# Patient Record
Sex: Male | Born: 1945 | Race: White | Hispanic: No | Marital: Married | Smoking: Former smoker
Health system: Southern US, Community
[De-identification: ages and names within clinical notes are randomized; demographics above are authoritative.]

## PROBLEM LIST (undated history)

## (undated) DIAGNOSIS — Z8719 Personal history of other diseases of the digestive system: Secondary | ICD-10-CM

## (undated) DIAGNOSIS — M1990 Unspecified osteoarthritis, unspecified site: Secondary | ICD-10-CM

## (undated) DIAGNOSIS — N400 Enlarged prostate without lower urinary tract symptoms: Secondary | ICD-10-CM

## (undated) DIAGNOSIS — C801 Malignant (primary) neoplasm, unspecified: Secondary | ICD-10-CM

## (undated) DIAGNOSIS — K5792 Diverticulitis of intestine, part unspecified, without perforation or abscess without bleeding: Secondary | ICD-10-CM

## (undated) DIAGNOSIS — Z86010 Personal history of colonic polyps: Secondary | ICD-10-CM

## (undated) DIAGNOSIS — H25019 Cortical age-related cataract, unspecified eye: Secondary | ICD-10-CM

## (undated) HISTORY — PX: HERNIA REPAIR: SHX51

## (undated) HISTORY — PX: COLONOSCOPY: SHX174

## (undated) HISTORY — PX: JOINT REPLACEMENT: SHX530

## (undated) HISTORY — PX: REPLACEMENT TOTAL KNEE: SUR1224

## (undated) HISTORY — PX: EYE SURGERY: SHX253

---
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
## 2005-10-13 ENCOUNTER — Ambulatory Visit: Payer: Self-pay | Admitting: Otolaryngology

## 2011-07-03 ENCOUNTER — Ambulatory Visit: Payer: Self-pay | Admitting: Family Medicine

## 2014-07-14 ENCOUNTER — Ambulatory Visit: Payer: Self-pay | Admitting: Ophthalmology

## 2014-07-24 HISTORY — PX: CATARACT EXTRACTION W/ INTRAOCULAR LENS IMPLANT: SHX1309

## 2014-07-25 ENCOUNTER — Ambulatory Visit: Payer: Self-pay | Admitting: Ophthalmology

## 2014-11-05 NOTE — Op Note (Signed)
PATIENT NAME:  Corey Edwards, Corey Edwards MR#:  [id number] DATE OF BIRTH:  1946-06-30  DATE OF PROCEDURE:  07/25/2014  PREOPERATIVE DIAGNOSIS:  Nuclear sclerotic cataract of the left eye.   POSTOPERATIVE DIAGNOSIS:  Nuclear sclerotic cataract of the left eye.   OPERATIVE PROCEDURE:  Cataract extraction by phacoemulsification with implant of intraocular lens to left eye.   SURGEON:  Birder Robson, MD.   ANESTHESIA:  1. Managed anesthesia care.  2. Topical tetracaine drops followed by 2% Xylocaine jelly applied in the preoperative holding area.   COMPLICATIONS:  None.   TECHNIQUE:   Stop and chop.  DESCRIPTION OF PROCEDURE:  The patient was examined and consented in the preoperative holding area where the aforementioned topical anesthesia was applied to the left eye and then brought back to the operating room where the left eye was prepped and draped in the usual sterile ophthalmic fashion and a lid speculum was placed. A paracentesis was created with the side port blade and the anterior chamber was filled with viscoelastic. A near clear corneal incision was performed with the steel keratome. A continuous curvilinear capsulorrhexis was performed with a cystotome followed by the capsulorrhexis forceps. Hydrodissection and hydrodelineation were carried out with BSS on a blunt cannula. The lens was removed in a stop and chop technique and the remaining cortical material was removed with the irrigation-aspiration handpiece. The capsular bag was inflated with viscoelastic and the Tecnis ZCB00 18.5-diopter lens, serial number [id number] was placed in the capsular bag without complication. The remaining viscoelastic was removed from the eye with the irrigation-aspiration handpiece. The wounds were hydrated. The anterior chamber was flushed with Miostat and the eye was inflated to physiologic pressure. 0.1 mL of cefuroxime concentration 10 mg/mL was placed in the anterior chamber. The wounds were found to be water  tight. The eye was dressed with Vigamox. The patient was given protective glasses to wear throughout the day and a shield with which to sleep tonight. The patient was also given drops with which to begin a drop regimen today and will follow-up with me in one day.   Please note that a single 10-0 nylon suture was placed through the incision for added security.    ____________________________ Livingston Diones Avenly Roberge, MD wlp:bm D: 07/25/2014 20:51:21 ET T: 07/25/2014 23:03:33 ET JOB#: [id number]  cc: Lyon Dumont L. Valory Wetherby, MD, <Dictator> Livingston Diones Charlie Char MD ELECTRONICALLY SIGNED 07/26/2014 16:10

## 2015-04-11 ENCOUNTER — Other Ambulatory Visit: Payer: Self-pay | Admitting: Unknown Physician Specialty

## 2015-04-11 DIAGNOSIS — M25561 Pain in right knee: Secondary | ICD-10-CM

## 2015-04-11 DIAGNOSIS — M25562 Pain in left knee: Principal | ICD-10-CM

## 2015-04-13 ENCOUNTER — Other Ambulatory Visit: Payer: Self-pay | Admitting: Unknown Physician Specialty

## 2015-04-20 ENCOUNTER — Other Ambulatory Visit: Payer: Self-pay

## 2015-04-23 ENCOUNTER — Ambulatory Visit
Admission: RE | Admit: 2015-04-23 | Discharge: 2015-04-23 | Disposition: A | Discharge status: Home / Self Care | Payer: Medicare Other | Source: Ambulatory Visit | Attending: Unknown Physician Specialty | Admitting: Unknown Physician Specialty

## 2015-04-23 DIAGNOSIS — M25461 Effusion, right knee: Secondary | ICD-10-CM | POA: Diagnosis not present

## 2015-04-23 DIAGNOSIS — X58XXXA Exposure to other specified factors, initial encounter: Secondary | ICD-10-CM | POA: Diagnosis not present

## 2015-04-23 DIAGNOSIS — M25562 Pain in left knee: Secondary | ICD-10-CM | POA: Diagnosis present

## 2015-04-23 DIAGNOSIS — M7122 Synovial cyst of popliteal space [Baker], left knee: Secondary | ICD-10-CM | POA: Insufficient documentation

## 2015-04-23 DIAGNOSIS — M659 Synovitis and tenosynovitis, unspecified: Secondary | ICD-10-CM | POA: Insufficient documentation

## 2015-04-23 DIAGNOSIS — M25561 Pain in right knee: Secondary | ICD-10-CM

## 2015-04-23 DIAGNOSIS — S83251A Bucket-handle tear of lateral meniscus, current injury, right knee, initial encounter: Secondary | ICD-10-CM | POA: Diagnosis not present

## 2015-05-07 ENCOUNTER — Encounter: Payer: Self-pay | Admitting: *Deleted

## 2015-05-11 ENCOUNTER — Ambulatory Visit
Admission: RE | Admit: 2015-05-11 | Discharge: 2015-05-11 | Disposition: A | Discharge status: Home / Self Care | Payer: Medicare Other | Source: Ambulatory Visit | Attending: Unknown Physician Specialty | Admitting: Unknown Physician Specialty

## 2015-05-11 ENCOUNTER — Ambulatory Visit: Payer: Medicare Other | Admitting: Anesthesiology

## 2015-05-11 ENCOUNTER — Encounter
Admission: RE | Disposition: A | Discharge status: Home / Self Care | Payer: Self-pay | Source: Ambulatory Visit | Attending: Unknown Physician Specialty

## 2015-05-11 DIAGNOSIS — Z91048 Other nonmedicinal substance allergy status: Secondary | ICD-10-CM | POA: Insufficient documentation

## 2015-05-11 DIAGNOSIS — M23251 Derangement of posterior horn of lateral meniscus due to old tear or injury, right knee: Secondary | ICD-10-CM | POA: Diagnosis not present

## 2015-05-11 DIAGNOSIS — M23241 Derangement of anterior horn of lateral meniscus due to old tear or injury, right knee: Secondary | ICD-10-CM | POA: Insufficient documentation

## 2015-05-11 DIAGNOSIS — Z885 Allergy status to narcotic agent status: Secondary | ICD-10-CM | POA: Insufficient documentation

## 2015-05-11 DIAGNOSIS — Z881 Allergy status to other antibiotic agents status: Secondary | ICD-10-CM | POA: Insufficient documentation

## 2015-05-11 DIAGNOSIS — Z888 Allergy status to other drugs, medicaments and biological substances status: Secondary | ICD-10-CM | POA: Diagnosis not present

## 2015-05-11 DIAGNOSIS — M25561 Pain in right knee: Secondary | ICD-10-CM | POA: Diagnosis present

## 2015-05-11 DIAGNOSIS — Z79899 Other long term (current) drug therapy: Secondary | ICD-10-CM | POA: Diagnosis not present

## 2015-05-11 DIAGNOSIS — H259 Unspecified age-related cataract: Secondary | ICD-10-CM | POA: Diagnosis not present

## 2015-05-11 DIAGNOSIS — Z9842 Cataract extraction status, left eye: Secondary | ICD-10-CM | POA: Diagnosis not present

## 2015-05-11 DIAGNOSIS — N400 Enlarged prostate without lower urinary tract symptoms: Secondary | ICD-10-CM | POA: Insufficient documentation

## 2015-05-11 HISTORY — DX: Unspecified osteoarthritis, unspecified site: M19.90

## 2015-05-11 HISTORY — DX: Benign prostatic hyperplasia without lower urinary tract symptoms: N40.0

## 2015-05-11 HISTORY — PX: KNEE ARTHROSCOPY: SHX127

## 2015-05-11 SURGERY — ARTHROSCOPY, KNEE
Anesthesia: General | Site: Knee | Laterality: Right | Wound class: Clean

## 2015-05-11 MED ORDER — DEXAMETHASONE SODIUM PHOSPHATE 4 MG/ML IJ SOLN
INTRAMUSCULAR | Status: DC | PRN
  Administered 2015-05-11: 4 mg via INTRAVENOUS

## 2015-05-11 MED ORDER — OXYCODONE HCL 5 MG/5ML PO SOLN: 5.0000 mg | Freq: Once | ORAL | Status: AC | PRN

## 2015-05-11 MED ORDER — LIDOCAINE HCL (CARDIAC) 20 MG/ML IV SOLN
INTRAVENOUS | Status: DC | PRN
  Administered 2015-05-11: 50 mg via INTRATRACHEAL

## 2015-05-11 MED ORDER — PROMETHAZINE HCL 25 MG/ML IJ SOLN: 6.2500 mg | INTRAMUSCULAR | Status: DC | PRN

## 2015-05-11 MED ORDER — LACTATED RINGERS IV SOLN
INTRAVENOUS | Status: DC
  Administered 2015-05-11: 10:00:00 via INTRAVENOUS

## 2015-05-11 MED ORDER — FENTANYL CITRATE (PF) 100 MCG/2ML IJ SOLN
INTRAMUSCULAR | Status: DC | PRN
  Administered 2015-05-11: 100 ug via INTRAVENOUS

## 2015-05-11 MED ORDER — MIDAZOLAM HCL 5 MG/5ML IJ SOLN
INTRAMUSCULAR | Status: DC | PRN
  Administered 2015-05-11: 2 mg via INTRAVENOUS

## 2015-05-11 MED ORDER — MEPERIDINE HCL 25 MG/ML IJ SOLN: 6.2500 mg | INTRAMUSCULAR | Status: DC | PRN

## 2015-05-11 MED ORDER — EPHEDRINE SULFATE 50 MG/ML IJ SOLN
INTRAMUSCULAR | Status: DC | PRN
  Administered 2015-05-11: 10 mg via INTRAVENOUS

## 2015-05-11 MED ORDER — PROPOFOL 10 MG/ML IV BOLUS
INTRAVENOUS | Status: DC | PRN
  Administered 2015-05-11: 150 mg via INTRAVENOUS

## 2015-05-11 MED ORDER — GLYCOPYRROLATE 0.2 MG/ML IJ SOLN
INTRAMUSCULAR | Status: DC | PRN
  Administered 2015-05-11: 0.1 mg via INTRAVENOUS

## 2015-05-11 MED ORDER — OXYCODONE HCL 5 MG PO TABS
5.0000 mg | ORAL_TABLET | Freq: Once | ORAL | Status: AC | PRN
Start: 2015-05-11 — End: 2015-05-11
  Administered 2015-05-11: 5 mg via ORAL

## 2015-05-11 MED ORDER — BUPIVACAINE HCL (PF) 0.5 % IJ SOLN
INTRAMUSCULAR | Status: DC | PRN
  Administered 2015-05-11: 10 mL

## 2015-05-11 MED ORDER — ONDANSETRON HCL 4 MG/2ML IJ SOLN
INTRAMUSCULAR | Status: DC | PRN
  Administered 2015-05-11: 4 mg via INTRAVENOUS

## 2015-05-11 MED ORDER — HYDROMORPHONE HCL 1 MG/ML IJ SOLN
0.2500 mg | INTRAMUSCULAR | Status: DC | PRN
  Administered 2015-05-11: 0.4 mg via INTRAVENOUS

## 2015-05-11 SURGICAL SUPPLY — 41 items
ARTHROWAND PARAGON T2 (SURGICAL WAND)
BLADE ABRADER 4.5 (BLADE) ×2 IMPLANT
BLADE FULL RADIUS 3.5 (BLADE) IMPLANT
BLADE SHAVER 4.5X7 STR FR (MISCELLANEOUS) ×2 IMPLANT
BLADE SHAVER AGGRES 5.5  STR (CUTTER)
BLADE SHAVER AGGRES 5.5 STR (CUTTER) IMPLANT
BNDG ESMARK 6X12 TAN STRL LF (GAUZE/BANDAGES/DRESSINGS) IMPLANT
BUR ABRADER 4.0 W/FLUTE AQUA (MISCELLANEOUS) IMPLANT
BUR ACROMIONIZER 4.0 (BURR) IMPLANT
BURR ABRADER 4.0 W/FLUTE AQUA (MISCELLANEOUS)
BURR ROUND 12 FLUTE 4.0MM (BURR) IMPLANT
COVER LIGHT HANDLE FLEXIBLE (MISCELLANEOUS) ×2 IMPLANT
CUFF TOURN SGL QUICK 30 (MISCELLANEOUS) ×1
CUFF TRNQT CYL LO 30X4X (MISCELLANEOUS) ×1 IMPLANT
CUTTER SLOTTED WHISKER 4.0 (BURR) IMPLANT
DRAPE LEGGINS SURG 28X43 STRL (DRAPES) ×2 IMPLANT
DURAPREP 26ML APPLICATOR (WOUND CARE) ×2 IMPLANT
GAUZE SPONGE 4X4 12PLY STRL (GAUZE/BANDAGES/DRESSINGS) ×2 IMPLANT
GLOVE BIO SURGEON STRL SZ7.5 (GLOVE) ×4 IMPLANT
GLOVE BIO SURGEON STRL SZ8 (GLOVE) ×2 IMPLANT
GLOVE INDICATOR 8.0 STRL GRN (GLOVE) ×4 IMPLANT
GOWN STRL REIN 2XL XLG LVL4 (GOWN DISPOSABLE) ×4 IMPLANT
GOWN STRL REUS W/TWL 2XL LVL3 (GOWN DISPOSABLE) IMPLANT
IV LACTATED RINGER IRRG 3000ML (IV SOLUTION) ×2
IV LR IRRIG 3000ML ARTHROMATIC (IV SOLUTION) ×2 IMPLANT
MANIFOLD 4PT FOR NEPTUNE1 (MISCELLANEOUS) ×2 IMPLANT
PACK ARTHROSCOPY KNEE (MISCELLANEOUS) ×2 IMPLANT
SET TUBE SUCT SHAVER OUTFL 24K (TUBING) ×2 IMPLANT
SOL PREP PVP 2OZ (MISCELLANEOUS) ×2
SOLUTION PREP PVP 2OZ (MISCELLANEOUS) ×1 IMPLANT
SUT ETHILON 3-0 FS-10 30 BLK (SUTURE) ×2
SUTURE EHLN 3-0 FS-10 30 BLK (SUTURE) ×1 IMPLANT
TAPE MICROFOAM 4IN (TAPE) ×2 IMPLANT
TUBING ARTHRO INFLOW-ONLY STRL (TUBING) ×2 IMPLANT
WAND ARTHRO PARAGON T2 (SURGICAL WAND) IMPLANT
WAND COVAC 50 IFS (MISCELLANEOUS) ×2 IMPLANT
WAND HAND CNTRL MULTIVAC 50 (MISCELLANEOUS) IMPLANT
WAND HAND CNTRL MULTIVAC 90 (MISCELLANEOUS) IMPLANT
WAND MEGAVAC 90 (MISCELLANEOUS) IMPLANT
WAND ULTRAVAC 90 (MISCELLANEOUS) IMPLANT
WRAP KNEE W/COLD PACKS 25.5X14 (SOFTGOODS) ×2 IMPLANT

## 2015-05-11 NOTE — Discharge Instructions (Signed)
General Anesthesia, Adult, Care After Refer to this sheet in the next few weeks. These instructions provide you with information on caring for yourself after your procedure. Your health care provider may also give you more specific instructions. Your treatment has been planned according to current medical practices, but problems sometimes occur. Call your health care provider if you have any problems or questions after your procedure. WHAT TO EXPECT AFTER THE PROCEDURE After the procedure, it is typical to experience:  Sleepiness.  Nausea and vomiting. HOME CARE INSTRUCTIONS  For the first 24 hours after general anesthesia:  Have a responsible person with you.  Do not drive a car. If you are alone, do not take public transportation.  Do not drink alcohol.  Do not take medicine that has not been prescribed by your health care provider.  Do not sign important papers or make important decisions.  You may resume a normal diet and activities as directed by your health care provider.  Change bandages (dressings) as directed.  If you have questions or problems that seem related to general anesthesia, call the hospital and ask for the anesthetist or anesthesiologist on call. SEEK MEDICAL CARE IF:  You have nausea and vomiting that continue the day after anesthesia.  You develop a rash. SEEK IMMEDIATE MEDICAL CARE IF:   You have difficulty breathing.  You have chest pain.  You have any allergic problems.   This information is not intended to replace advice given to you by your health care provider. Make sure you discuss any questions you have with your health care provider.   Document Released: 09/29/2000 Document Revised: 07/14/2014 Document Reviewed: 10/22/2011 Elsevier Interactive Patient Education 2016 [employer]   [facility]  Kathrene Alu., M.D. [phone]   KNEE ARTHROSCOPY POST OPERATION INSTRUCTIONS:  PLEASE  READ THESE INSTRUCTIONS ABOUT POST OPERATION CARE. THEY WILL ANSWER MOST OF YOUR QUESTIONS.  You have been given a prescription for pain. Please take as directed for pain.  You can walk, keeping the knee slightly stiff-avoid doing too much bending the first day. (if ACL reconstruction is performed, keep brace locked in extension when walking.)  You will use crutches or cane if needed. Can weight bear as tolerated  Plan to take three to four days off from work. You can resume work when you are comfortable. (This can be a week or more, depending on the type of work you do.)  To reduce pain and swelling, place one to two pillows under the knee the first two or three days when sitting or lying. An ice pack may be placed on top of the area over the dressing. Instructions for making homemade icepack are as follow:  Flexible homemade alcohol water ice pack  2 cups water  1 cup rubbing alcohol  food coloring for the blue tint (optional)  2 zip-top bags - gallon-size  Mix the water and alcohol together in one of your zip-top bags and add food coloring. Release as much air as possible and seal the bag. Place in freezer for at least 12 hours.  The small incisions in your knee are closed with nylon stitches. They will be removed in the office.  The bulky dressing may be removed in the third day after surgery. (If ACL surgery-DO NOT REMOVE BANDAGES). Put a waterproof band-aid over each stitch. Do not put any creams or ointments on wounds. You may shower at this time, but change waterproof band-aids after showering. [place]  AND DRY UNTIL YOU RETURN TO THE OFFICE.  Sometimes the operative area remains somewhat painful and swollen for several weeks. This is usually nothing to worry about, but call if you have any excessive symptoms, especially fever. It is not unusual to have a low grade fever of 99 degrees for the first few days. If persist after 3-4 days call the office. It is not uncommon for the pain  to be a little worse on the third day after surgery.  Begin doing gentle exercises right away. They will be limited by the amount of pain and swelling you have.  Exercising will reduce the swelling, increase motion, and prevent muscle weakness. Exercises: Straight leg raising and gentle knee bending.  Take 81 milligram aspirin twice a day for 2 weeks after meals or milk. This along with elevation will help reduce the possibility of phlebitis in your operated leg.  Avoid strenuous athletics for a minimum of 4 to 6 weeks after arthroscopic surgery (approximately five months if ACL surgery).  If the surgery included ACL reconstruction the brace that is supplied to the extremity post surgery is to be locked in extension when you are asleep and is to be locked in extension when you are ambulating. It can be unlocked for exercises or sitting.  Keep your post surgery appointment that has been made for you. If you do not remember the date call [phone]. Your follow up appointment should be between 7-10 days.

## 2015-05-11 NOTE — Anesthesia Preprocedure Evaluation (Signed)
Anesthesia Evaluation  Patient identified by MRN, date of birth, ID band Patient awake    Reviewed: Allergy & Precautions, NPO status , Patient's Chart, lab work & pertinent test results, reviewed documented beta blocker date and time   Airway Mallampati: II  TM Distance: >3 FB Neck ROM: Full    Dental no notable dental hx.    Pulmonary former smoker,    Pulmonary exam normal        Cardiovascular negative cardio ROS Normal cardiovascular exam     Neuro/Psych negative neurological ROS  negative psych ROS   GI/Hepatic negative GI ROS, Neg liver ROS,   Endo/Other  negative endocrine ROS  Renal/GU negative Renal ROS   BPH     Musculoskeletal  (+) Arthritis , Osteoarthritis,    Abdominal   Peds negative pediatric ROS (+)  Hematology negative hematology ROS (+)   Anesthesia Other Findings   Reproductive/Obstetrics                             Anesthesia Physical Anesthesia Plan  ASA: II  Anesthesia Plan: General   Post-op Pain Management:    Induction:   Airway Management Planned: LMA  Additional Equipment:   Intra-op Plan:   Post-operative Plan:   Informed Consent: I have reviewed the patients History and Physical, chart, labs and discussed the procedure including the risks, benefits and alternatives for the proposed anesthesia with the patient or authorized representative who has indicated his/her understanding and acceptance.     Plan Discussed with: CRNA  Anesthesia Plan Comments:         Anesthesia Quick Evaluation

## 2015-05-11 NOTE — Transfer of Care (Signed)
Immediate Anesthesia Transfer of Care Note  Patient: Corey Edwards  Procedure(s) Performed: Procedure(s): ARTHROSCOPY KNEE WITH PARTIAL LATERAL MENISECTOMY (Right)  Patient Location: PACU  Anesthesia Type: General  Level of Consciousness: awake, alert  and patient cooperative  Airway and Oxygen Therapy: Patient Spontanous Breathing and Patient connected to supplemental oxygen  Post-op Assessment: Post-op Vital signs reviewed, Patient's Cardiovascular Status Stable, Respiratory Function Stable, Patent Airway and No signs of Nausea or vomiting  Post-op Vital Signs: Reviewed and stable  Complications: No apparent anesthesia complications

## 2015-05-11 NOTE — Anesthesia Procedure Notes (Signed)
Procedure Name: LMA Insertion Date/Time: 05/11/2015 10:29 AM Performed by: Mayme Genta Pre-anesthesia Checklist: Patient identified, Emergency Drugs available, Suction available, Timeout performed and Patient being monitored Patient Re-evaluated:Patient Re-evaluated prior to inductionOxygen Delivery Method: Circle system utilized Preoxygenation: Pre-oxygenation with 100% oxygen Intubation Type: IV induction LMA: LMA inserted LMA Size: 4.0 Number of attempts: 1 Placement Confirmation: positive ETCO2 and breath sounds checked- equal and bilateral Tube secured with: Tape

## 2015-05-11 NOTE — Op Note (Signed)
Patient: Corey Edwards  Preoperative diagnosis: Torn lateral meniscus and early lateral compartment chondral changes right knee  Postop diagnosis: Same plus trochlear groove chondral changes  Operation: Arthroscopic partial lateral meniscectomy right knee  Surgeon: Vilinda Flake, MD  Anesthesia: Gen.   History: Patient's had a long history of right knee pain.  The plain films revealed possible early lateral compartment chondral changes .  The patient had an MRI which revealed a torn lateral meniscus along with lateral compartment chondral pathology.The patient was scheduled for surgery due to persistent discomfort despite conservative treatment.  The patient was taken the operating room where satisfactory general anesthesia was achieved. A tourniquet and leg holder were was applied to the right thigh. A well leg support was applied to the nonoperative extremity. The right knee was prepped and draped in usual fashion for an arthroscopic procedure. An inflow cannula was introduced superomedially. The joint was distended with lactated Ringer's. Scope was introduced through an inferolateral puncture wound and a probe through an inferomedial puncture wound. Inspection of the medial compartment revealed  no meniscal or chondral pathology. Inspection of the intercondylar notch revealed intact cruciates. Inspection of the the lateral compartment revealed a degenerative tear of the lateral meniscus that involved anterior and posterior horns. I went ahead and resected the torn lateral meniscus using a combination of a motorized resector and an angled ArthroCare wand.  Trochlear groove was inspected and revealed some grade 3 chondral changes  Retropatellar surface was fairly smooth. I observed patella tracking from the superomedial portal. The patella seemed to track fairly well.  The instruments were removed from the joint at this time. The puncture wounds were closed with 3-0 nylon in vertical mattress  fashion. I injected each puncture wound with several cc of half percent Marcaine without epinephrine. Betadine was applied the wounds followed by sterile dressing. An ice pack was applied to the right knee. The patient was awakened and transferred to the stretcher bed. The patient was taken to the recovery room in satisfactory condition.  The tourniquet was not inflated during the course of the procedure. Blood loss was negligible.

## 2015-05-11 NOTE — H&P (Signed)
  H and P reviewed. No changes. Uploaded at later date. 

## 2015-05-11 NOTE — Anesthesia Postprocedure Evaluation (Signed)
  Anesthesia Post-op Note  Patient: Corey Edwards  Procedure(s) Performed: Procedure(s): ARTHROSCOPY KNEE WITH PARTIAL LATERAL MENISECTOMY (Right)  Anesthesia type:General  Patient location: PACU  Post pain: Pain level controlled  Post assessment: Post-op Vital signs reviewed, Patient's Cardiovascular Status Stable, Respiratory Function Stable, Patent Airway and No signs of Nausea or vomiting  Post vital signs: Reviewed and stable  Last Vitals:  Filed Vitals:   05/11/15 1222  BP:   Pulse: 83  Temp:   Resp: 18    Level of consciousness: awake, alert  and patient cooperative  Complications: No apparent anesthesia complications

## 2015-05-14 ENCOUNTER — Encounter: Payer: Self-pay | Admitting: Unknown Physician Specialty

## 2016-06-17 ENCOUNTER — Other Ambulatory Visit: Payer: Self-pay | Admitting: Unknown Physician Specialty

## 2016-06-17 DIAGNOSIS — M1711 Unilateral primary osteoarthritis, right knee: Secondary | ICD-10-CM

## 2016-06-27 ENCOUNTER — Ambulatory Visit: Payer: Medicare Other

## 2016-07-12 ENCOUNTER — Encounter: Payer: Self-pay | Admitting: Emergency Medicine

## 2016-07-12 ENCOUNTER — Emergency Department
Admission: EM | Admit: 2016-07-12 | Discharge: 2016-07-12 | Disposition: A | Discharge status: Home / Self Care | Payer: Medicare Other | Attending: Emergency Medicine | Admitting: Emergency Medicine

## 2016-07-12 DIAGNOSIS — Z87891 Personal history of nicotine dependence: Secondary | ICD-10-CM | POA: Diagnosis not present

## 2016-07-12 DIAGNOSIS — R339 Retention of urine, unspecified: Secondary | ICD-10-CM

## 2016-07-12 DIAGNOSIS — Z79899 Other long term (current) drug therapy: Secondary | ICD-10-CM | POA: Insufficient documentation

## 2016-07-12 DIAGNOSIS — M7989 Other specified soft tissue disorders: Secondary | ICD-10-CM | POA: Insufficient documentation

## 2016-07-12 LAB — URINALYSIS, COMPLETE (UACMP) WITH MICROSCOPIC
BACTERIA UA: NONE SEEN
BILIRUBIN URINE: NEGATIVE
Glucose, UA: NEGATIVE mg/dL
KETONES UR: 5 mg/dL — AB
LEUKOCYTES UA: NEGATIVE
NITRITE: NEGATIVE
Protein, ur: NEGATIVE mg/dL
SPECIFIC GRAVITY, URINE: 1.011 (ref 1.005–1.030)
SQUAMOUS EPITHELIAL / LPF: NONE SEEN
pH: 6 (ref 5.0–8.0)

## 2016-07-12 NOTE — ED Provider Notes (Signed)
[facility] Provider Note   CSN: [id number] Arrival date & time: 07/12/16  1519     History   Chief Complaint Chief Complaint  Patient presents with  . Urinary Retention    HPI Corey Edwards is a 71 y.o. male presents to the emergency department for evaluation of urinary retention. Patient is status post right total knee arthroplasty performed on 07/08/2016, had Foley catheter placed and since removal on 07/09/2016, patient has had decreased urinary output. Over the last few days his symptoms have been increasing, he has been unable to void as much as normal. He has a history of BPH and is on Avodart and Flomax. He sees a Dealer at [facility]. Today, patient developed significant pressure and inability to void. Upon arrival to the ED he had a bladder scan showing 800 cc, Foley catheter was placed and patient had complete resolution of his abdominal pain. He is currently doing well with no pain or discomfort. Denies any fevers or back pain. No nausea or vomiting. Patient denies taking any new medications.  Marland KitchenHPI  Past Medical History:  Diagnosis Date  . Arthritis    knees  . BPH (benign prostatic hypertrophy)     There are no active problems to display for this patient.   Past Surgical History:  Procedure Laterality Date  . CATARACT EXTRACTION W/ INTRAOCULAR LENS IMPLANT Left 07/24/14  . COLONOSCOPY  09/18/03, 06/12/10, 11/15/13  . KNEE ARTHROSCOPY Right 05/11/2015   Procedure: ARTHROSCOPY KNEE WITH PARTIAL LATERAL MENISECTOMY;  Surgeon: Leanor Kail, MD;  Location: [facility];  Service: Orthopedics;  Laterality: Right;  . REPLACEMENT TOTAL KNEE Right        Home Medications    Prior to Admission medications   Medication Sig Start Date End Date Taking? Authorizing Provider  dutasteride (AVODART) 0.5 MG capsule Take 0.5 mg by mouth daily. AM    Historical Provider, MD  psyllium (METAMUCIL) 58.6 % powder Take 1 packet by mouth daily. lunch    Historical  Provider, MD  tamsulosin (FLOMAX) 0.4 MG CAPS capsule Take 0.4 mg by mouth. AM    Historical Provider, MD    Family History History reviewed. No pertinent family history.  Social History Social History  Substance Use Topics  . Smoking status: Former Research scientist (life sciences)  . Smokeless tobacco: Never Used     Comment: quit 20 yrs ago  . Alcohol use 1.8 oz/week    3 Standard drinks or equivalent per week     Allergies   Adhesive [tape]; Codeine; Neosporin [neomycin-bacitracin zn-polymyx]; and Silver nitrate   Review of Systems Review of Systems  Constitutional: Negative.  Negative for activity change, appetite change, chills and fever.  HENT: Negative for congestion, ear pain, mouth sores, rhinorrhea, sinus pressure, sore throat and trouble swallowing.   Eyes: Negative for photophobia, pain and discharge.  Respiratory: Negative for cough, chest tightness and shortness of breath.   Cardiovascular: Negative for chest pain and leg swelling.  Gastrointestinal: Negative for abdominal distention, abdominal pain, diarrhea, nausea and vomiting.  Genitourinary: Positive for decreased urine volume and difficulty urinating. Negative for dysuria and flank pain.  Musculoskeletal: Negative for arthralgias, back pain and gait problem.  Skin: Negative for color change and rash.  Neurological: Negative for dizziness and headaches.  Hematological: Negative for adenopathy.  Psychiatric/Behavioral: Negative for agitation and behavioral problems.     Physical Exam Updated Vital Signs BP (!) 140/98 (BP Location: Right Arm)   Pulse (!) 104   Temp 98.2 F (36.8 C) (Oral)  Ht 5\' 9"  (1.753 m)   Wt 94.8 kg   SpO2 94%   BMI 30.86 kg/m   Physical Exam  Constitutional: He is oriented to person, place, and time. He appears well-developed and well-nourished.  HENT:  Head: Normocephalic and atraumatic.  Right Ear: External ear normal.  Eyes: Conjunctivae and EOM are normal.  Neck: Normal range of motion. Neck  supple.  Pulmonary/Chest: Effort normal.  Abdominal: Soft. Bowel sounds are normal. He exhibits no distension and no mass. There is no tenderness. There is no rebound and no guarding.  Musculoskeletal: Normal range of motion.  Right lower extremity with TED hose applied. Mild swelling throughout the leg. Dressing clean dry and intact. No warmth erythema.  Neurological: He is alert and oriented to person, place, and time.  Skin: Skin is warm. No erythema.     ED Treatments / Results  Labs (all labs ordered are listed, but only abnormal results are displayed) Labs Reviewed  URINALYSIS, COMPLETE (UACMP) WITH MICROSCOPIC - Abnormal; Notable for the following:       Result Value   Color, Urine YELLOW (*)    APPearance CLEAR (*)    Hgb urine dipstick SMALL (*)    Ketones, ur 5 (*)    All other components within normal limits    EKG  EKG Interpretation None       Radiology No results found.  Procedures Procedures (including critical care time)  Medications Ordered in ED Medications - No data to display   Initial Impression / Assessment and Plan / ED Course  I have reviewed the triage vital signs and the nursing notes.  Pertinent labs & imaging results that were available during my care of the patient were reviewed by me and considered in my medical decision making (see chart for details).  Clinical Course     71 year old male with acute urinary retention. Foley catheter placed with leg bag. He is shown how to use an drain catheter bag. He will follow-up with urologist.  Final Clinical Impressions(s) / ED Diagnoses   Final diagnoses:  Urinary retention    New Prescriptions New Prescriptions   No medications on file     Duanne Guess, PA-C 07/12/16 [place], MD 07/13/16 [phone]

## 2016-07-12 NOTE — ED Triage Notes (Signed)
Pt from home with urinary retention. He has BPH, and had surgery on Tuesday and was catheterized, which he believes led to the retention. He had similar problem 10 years ago when he had surgery and was catheterized. Pt takes avodart and flomax. Pt alert & oriented with NAD noted.

## 2016-07-12 NOTE — Discharge Instructions (Signed)
Please follow-up with Dr. Erlene Quan in 2-3 days. Return to the ER for any fevers, abdominal pain, worsening symptoms urgent changes in her health.

## 2017-05-18 ENCOUNTER — Encounter: Payer: Self-pay | Admitting: *Deleted

## 2017-05-20 ENCOUNTER — Ambulatory Visit: Payer: Medicare Other | Admitting: Anesthesiology

## 2017-05-20 ENCOUNTER — Ambulatory Visit
Admission: RE | Admit: 2017-05-20 | Discharge: 2017-05-20 | Disposition: A | Discharge status: Home / Self Care | Payer: Medicare Other | Source: Ambulatory Visit | Attending: Ophthalmology | Admitting: Ophthalmology

## 2017-05-20 ENCOUNTER — Encounter
Admission: RE | Disposition: A | Discharge status: Home / Self Care | Payer: Self-pay | Source: Ambulatory Visit | Attending: Ophthalmology

## 2017-05-20 ENCOUNTER — Other Ambulatory Visit: Payer: Self-pay

## 2017-05-20 DIAGNOSIS — Z79899 Other long term (current) drug therapy: Secondary | ICD-10-CM | POA: Diagnosis not present

## 2017-05-20 DIAGNOSIS — Z96659 Presence of unspecified artificial knee joint: Secondary | ICD-10-CM | POA: Insufficient documentation

## 2017-05-20 DIAGNOSIS — N400 Enlarged prostate without lower urinary tract symptoms: Secondary | ICD-10-CM | POA: Diagnosis not present

## 2017-05-20 DIAGNOSIS — H2511 Age-related nuclear cataract, right eye: Secondary | ICD-10-CM | POA: Insufficient documentation

## 2017-05-20 DIAGNOSIS — Z87891 Personal history of nicotine dependence: Secondary | ICD-10-CM | POA: Diagnosis not present

## 2017-05-20 HISTORY — PX: CATARACT EXTRACTION W/PHACO: SHX586

## 2017-05-20 SURGERY — PHACOEMULSIFICATION, CATARACT, WITH IOL INSERTION
Anesthesia: Monitor Anesthesia Care | Site: Eye | Laterality: Right | Wound class: Clean

## 2017-05-20 MED ORDER — MOXIFLOXACIN HCL 0.5 % OP SOLN: 1.0000 [drp] | OPHTHALMIC | Status: DC | PRN

## 2017-05-20 MED ORDER — MIDAZOLAM HCL 2 MG/2ML IJ SOLN
INTRAMUSCULAR | Status: AC
  Filled 2017-05-20: qty 2

## 2017-05-20 MED ORDER — POVIDONE-IODINE 5 % OP SOLN
OPHTHALMIC | Status: AC
  Filled 2017-05-20: qty 30

## 2017-05-20 MED ORDER — MIDAZOLAM HCL 2 MG/2ML IJ SOLN
INTRAMUSCULAR | Status: DC | PRN
  Administered 2017-05-20: 2 mg via INTRAVENOUS

## 2017-05-20 MED ORDER — LIDOCAINE HCL (PF) 4 % IJ SOLN
INTRAMUSCULAR | Status: AC
  Filled 2017-05-20: qty 5

## 2017-05-20 MED ORDER — SODIUM CHLORIDE 0.9 % IV SOLN
INTRAVENOUS | Status: DC
  Administered 2017-05-20 (×2): via INTRAVENOUS

## 2017-05-20 MED ORDER — MOXIFLOXACIN HCL 0.5 % OP SOLN
OPHTHALMIC | Status: DC | PRN
  Administered 2017-05-20: .2 mL via OPHTHALMIC

## 2017-05-20 MED ORDER — LIDOCAINE HCL (PF) 4 % IJ SOLN
INTRAOCULAR | Status: DC | PRN
  Administered 2017-05-20: 2 mL via OPHTHALMIC

## 2017-05-20 MED ORDER — NA CHONDROIT SULF-NA HYALURON 40-17 MG/ML IO SOLN
INTRAOCULAR | Status: AC
  Filled 2017-05-20: qty 1

## 2017-05-20 MED ORDER — EPINEPHRINE PF 1 MG/ML IJ SOLN
INTRAMUSCULAR | Status: AC
  Filled 2017-05-20: qty 1

## 2017-05-20 MED ORDER — ARMC OPHTHALMIC DILATING DROPS
1.0000 "application " | OPHTHALMIC | Status: AC
  Administered 2017-05-20 (×3): 1 via OPHTHALMIC

## 2017-05-20 MED ORDER — CARBACHOL 0.01 % IO SOLN
INTRAOCULAR | Status: DC | PRN
  Administered 2017-05-20: .5 mL via INTRAOCULAR

## 2017-05-20 MED ORDER — NA CHONDROIT SULF-NA HYALURON 40-17 MG/ML IO SOLN
INTRAOCULAR | Status: DC | PRN
  Administered 2017-05-20: 1 mL via INTRAOCULAR

## 2017-05-20 MED ORDER — POVIDONE-IODINE 5 % OP SOLN
OPHTHALMIC | Status: DC | PRN
  Administered 2017-05-20: 1 via OPHTHALMIC

## 2017-05-20 MED ORDER — EPINEPHRINE PF 1 MG/ML IJ SOLN
INTRAMUSCULAR | Status: DC | PRN
  Administered 2017-05-20: 1 mL via OPHTHALMIC

## 2017-05-20 SURGICAL SUPPLY — 16 items
GLOVE BIO SURGEON STRL SZ8 (GLOVE) ×3 IMPLANT
GLOVE BIOGEL M 6.5 STRL (GLOVE) ×3 IMPLANT
GLOVE SURG LX 8.0 MICRO (GLOVE) ×2
GLOVE SURG LX STRL 8.0 MICRO (GLOVE) ×1 IMPLANT
GOWN STRL REUS W/ TWL LRG LVL3 (GOWN DISPOSABLE) ×2 IMPLANT
GOWN STRL REUS W/TWL LRG LVL3 (GOWN DISPOSABLE) ×4
LABEL CATARACT MEDS ST (LABEL) ×3 IMPLANT
LENS IOL TECNIS ITEC 20.5 (Intraocular Lens) ×3 IMPLANT
PACK CATARACT (MISCELLANEOUS) ×3 IMPLANT
PACK CATARACT BRASINGTON LX (MISCELLANEOUS) ×3 IMPLANT
PACK EYE AFTER SURG (MISCELLANEOUS) ×3 IMPLANT
SOL BSS BAG (MISCELLANEOUS) ×3
SOLUTION BSS BAG (MISCELLANEOUS) ×1 IMPLANT
SYR 5ML LL (SYRINGE) ×3 IMPLANT
WATER STERILE IRR 250ML POUR (IV SOLUTION) ×3 IMPLANT
WIPE NON LINTING 3.25X3.25 (MISCELLANEOUS) ×3 IMPLANT

## 2017-05-20 NOTE — Discharge Instructions (Signed)
Eye Surgery Discharge Instructions  Expect mild scratchy sensation or mild soreness. DO NOT RUB YOUR EYE!  The day of surgery:  Minimal physical activity, but bed rest is not required  No reading, computer work, or close hand work  No bending, lifting, or straining.  May watch TV  For 24 hours:  No driving, legal decisions, or alcoholic beverages  Safety precautions  Eat anything you prefer: It is better to start with liquids, then soup then solid foods.  _____ Eye patch should be worn until postoperative exam tomorrow.  ____ Solar shield eyeglasses should be worn for comfort in the sunlight/patch while sleeping  Resume all regular medications including aspirin or Coumadin if these were discontinued prior to surgery. You may shower, bathe, shave, or wash your hair. Tylenol may be taken for mild discomfort.  Call your doctor if you experience significant pain, nausea, or vomiting, fever > 101 or other signs of infection. [phone] or [phone] Specific instructions:  Follow-up Information    Birder Robson, MD Follow up.   Specialty:  Ophthalmology Why:  November 15 at 9:05am Contact information: [address] [place] [address] [phone]

## 2017-05-20 NOTE — Anesthesia Preprocedure Evaluation (Signed)
Anesthesia Evaluation  Patient identified by MRN, date of birth, ID band Patient awake    Reviewed: Allergy & Precautions, H&P , NPO status , Patient's Chart, lab work & pertinent test results  History of Anesthesia Complications Negative for: history of anesthetic complications  Airway Mallampati: III  TM Distance: >3 FB Neck ROM: full    Dental  (+) Chipped, Caps   Pulmonary neg shortness of breath, former smoker,           Cardiovascular Exercise Tolerance: Good (-) angina(-) Past MI and (-) DOE      Neuro/Psych negative neurological ROS  negative psych ROS   GI/Hepatic negative GI ROS, Neg liver ROS,   Endo/Other  negative endocrine ROS  Renal/GU      Musculoskeletal  (+) Arthritis ,   Abdominal   Peds  Hematology negative hematology ROS (+)   Anesthesia Other Findings Past Medical History: No date: Arthritis     Comment:  knees No date: BPH (benign prostatic hypertrophy)  Past Surgical History: 07/24/14: CATARACT EXTRACTION W/ INTRAOCULAR LENS IMPLANT; Left 09/18/03, 06/12/10, 11/15/13: COLONOSCOPY No date: EYE SURGERY No date: HERNIA REPAIR No date: REPLACEMENT TOTAL KNEE; Right  BMI    Body Mass Index:  28.65 kg/m      Reproductive/Obstetrics negative OB ROS                             Anesthesia Physical Anesthesia Plan  ASA: III  Anesthesia Plan: MAC   Post-op Pain Management:    Induction: Intravenous  PONV Risk Score and Plan: Midazolam  Airway Management Planned: Natural Airway and Nasal Cannula  Additional Equipment:   Intra-op Plan:   Post-operative Plan:   Informed Consent: I have reviewed the patients History and Physical, chart, labs and discussed the procedure including the risks, benefits and alternatives for the proposed anesthesia with the patient or authorized representative who has indicated his/her understanding and acceptance.   Dental  Advisory Given  Plan Discussed with: Anesthesiologist, CRNA and Surgeon  Anesthesia Plan Comments: (Patient consented for risks of anesthesia including but not limited to:  - adverse reactions to medications - damage to teeth, lips or other oral mucosa - sore throat or hoarseness - Damage to heart, brain, lungs or loss of life  Patient voiced understanding.)        Anesthesia Quick Evaluation

## 2017-05-20 NOTE — Anesthesia Postprocedure Evaluation (Signed)
Anesthesia Post Note  Patient: Corey Edwards  Procedure(s) Performed: CATARACT EXTRACTION PHACO AND INTRAOCULAR LENS PLACEMENT (IOC) (Right Eye)  Patient location during evaluation: PACU Anesthesia Type: MAC Level of consciousness: awake, awake and alert, oriented and patient cooperative Pain management: pain level controlled Vital Signs Assessment: post-procedure vital signs reviewed and stable Respiratory status: spontaneous breathing, nonlabored ventilation and respiratory function stable Cardiovascular status: stable Anesthetic complications: no     Last Vitals:  Vitals:   05/20/17 0626  Pulse: 97  Resp: 16  Temp: (!) 36.4 C  SpO2: 98%    Last Pain:  Vitals:   05/20/17 0626  TempSrc: Temporal                 Jahniya Duzan,  [facility]

## 2017-05-20 NOTE — Op Note (Signed)
PREOPERATIVE DIAGNOSIS:  Nuclear sclerotic cataract of the right eye.   POSTOPERATIVE DIAGNOSIS:  nuclear sclerotic cataract right eye   OPERATIVE PROCEDURE: Procedure(s): CATARACT EXTRACTION PHACO AND INTRAOCULAR LENS PLACEMENT (IOC)   SURGEON:  Birder Robson, MD.   ANESTHESIA:  Anesthesiologist: Piscitello, Precious Haws, MD CRNA: Nelda Marseille, CRNA  1.      Managed anesthesia care. 2.      0.3ml of Shugarcaine was instilled in the eye following the paracentesis.   COMPLICATIONS:  None.   TECHNIQUE:   Stop and chop   DESCRIPTION OF PROCEDURE:  The patient was examined and consented in the preoperative holding area where the aforementioned topical anesthesia was applied to the right eye and then brought back to the Operating Room where the right eye was prepped and draped in the usual sterile ophthalmic fashion and a lid speculum was placed. A paracentesis was created with the side port blade and the anterior chamber was filled with viscoelastic. A near clear corneal incision was performed with the steel keratome. A continuous curvilinear capsulorrhexis was performed with a cystotome followed by the capsulorrhexis forceps. Hydrodissection and hydrodelineation were carried out with BSS on a blunt cannula. The lens was removed in a stop and chop  technique and the remaining cortical material was removed with the irrigation-aspiration handpiece. The capsular bag was inflated with viscoelastic and the Technis [id number]  lens was placed in the capsular bag without complication. The remaining viscoelastic was removed from the eye with the irrigation-aspiration handpiece. The wounds were hydrated. The anterior chamber was flushed with Miostat and the eye was inflated to physiologic pressure. 0.71ml of Vigamox was placed in the anterior chamber. The wounds were found to be water tight. The eye was dressed with Vigamox. The patient was given protective glasses to wear throughout the day and a shield with which  to sleep tonight. The patient was also given drops with which to begin a drop regimen today and will follow-up with me in one day. Implant Name Type Inv. Item Serial No. Manufacturer Lot No. LRB No. Used  LENS IOL DIOP 20.5 - [id number] Intraocular Lens LENS IOL DIOP 20.5 [id number]  Right 1   Procedure(s) with comments: CATARACT EXTRACTION PHACO AND INTRAOCULAR LENS PLACEMENT (IOC) (Right) - [place] 00:44 AP% 16.7 CDE 7.40 Fluid pack lot # [id number]  Electronically signed: [place] 05/20/2017 8:17 AM

## 2017-05-20 NOTE — Anesthesia Procedure Notes (Signed)
Performed by: Remijio Holleran, CRNA       

## 2017-05-20 NOTE — Anesthesia Post-op Follow-up Note (Signed)
Anesthesia QCDR form completed.        

## 2017-05-20 NOTE — H&P (Signed)
All labs reviewed. Abnormal studies sent to patients PCP when indicated.  Previous H&P reviewed, patient examined, there are NO CHANGES.  Corey Edwards LOUIS11/14/20187:52 AM

## 2017-05-20 NOTE — Anesthesia Procedure Notes (Signed)
Procedure Name: MAC Date/Time: 05/20/2017 7:59 AM Performed by: Nelda Marseille, CRNA Pre-anesthesia Checklist: Patient identified, Emergency Drugs available, Suction available, Patient being monitored and Timeout performed Oxygen Delivery Method: Nasal cannula

## 2017-05-20 NOTE — Transfer of Care (Signed)
Immediate Anesthesia Transfer of Care Note  Patient: Corey Edwards  Procedure(s) Performed: CATARACT EXTRACTION PHACO AND INTRAOCULAR LENS PLACEMENT (IOC) (Right Eye)  Patient Location: PACU  Anesthesia Type:MAC  Level of Consciousness: awake, alert  and oriented  Airway & Oxygen Therapy: Patient Spontanous Breathing and Patient connected to nasal cannula oxygen  Post-op Assessment: Report given to RN  Post vital signs: Reviewed and stable  Last Vitals:  Vitals:   05/20/17 0626  Pulse: 97  Resp: 16  Temp: (!) 36.4 C  SpO2: 98%    Last Pain:  Vitals:   05/20/17 0626  TempSrc: Temporal         Complications: No apparent anesthesia complications

## 2018-11-09 ENCOUNTER — Other Ambulatory Visit (HOSPITAL_COMMUNITY): Payer: Self-pay | Admitting: Orthopedic Surgery

## 2018-11-09 ENCOUNTER — Other Ambulatory Visit: Payer: Self-pay | Admitting: Orthopedic Surgery

## 2018-11-09 DIAGNOSIS — M25469 Effusion, unspecified knee: Secondary | ICD-10-CM

## 2018-11-16 ENCOUNTER — Encounter
Admission: RE | Admit: 2018-11-16 | Discharge: 2018-11-16 | Disposition: A | Discharge status: Home / Self Care | Payer: Medicare Other | Source: Ambulatory Visit | Attending: Orthopedic Surgery | Admitting: Orthopedic Surgery

## 2018-11-16 ENCOUNTER — Other Ambulatory Visit: Payer: Self-pay

## 2018-11-16 DIAGNOSIS — M25469 Effusion, unspecified knee: Secondary | ICD-10-CM | POA: Insufficient documentation

## 2018-11-16 MED ORDER — TECHNETIUM TC 99M MEDRONATE IV KIT
20.0000 | PACK | Freq: Once | INTRAVENOUS | Status: AC | PRN
  Administered 2018-11-16: 11:00:00 23.994 via INTRAVENOUS

## 2019-01-05 DIAGNOSIS — Z860100 Personal history of colon polyps, unspecified: Secondary | ICD-10-CM

## 2019-01-05 HISTORY — DX: Personal history of colon polyps, unspecified: Z86.0100

## 2019-01-05 HISTORY — DX: Personal history of colonic polyps: Z86.010

## 2019-02-10 ENCOUNTER — Other Ambulatory Visit
Admission: RE | Admit: 2019-02-10 | Discharge: 2019-02-10 | Disposition: A | Discharge status: Home / Self Care | Payer: Medicare Other | Source: Ambulatory Visit | Attending: Unknown Physician Specialty | Admitting: Unknown Physician Specialty

## 2019-03-11 ENCOUNTER — Other Ambulatory Visit
Admission: RE | Admit: 2019-03-11 | Discharge: 2019-03-11 | Disposition: A | Discharge status: Home / Self Care | Payer: Medicare Other | Source: Ambulatory Visit | Attending: Internal Medicine | Admitting: Internal Medicine

## 2019-03-11 ENCOUNTER — Encounter: Payer: Self-pay | Admitting: *Deleted

## 2019-03-11 ENCOUNTER — Other Ambulatory Visit: Payer: Self-pay

## 2019-03-15 ENCOUNTER — Ambulatory Visit
Admission: RE | Admit: 2019-03-15 | Discharge: 2019-03-15 | Disposition: A | Discharge status: Home / Self Care | Payer: Medicare Other | Source: Home / Self Care | Attending: Internal Medicine | Admitting: Internal Medicine

## 2019-03-15 ENCOUNTER — Other Ambulatory Visit: Payer: Self-pay

## 2019-03-15 ENCOUNTER — Encounter
Admission: RE | Disposition: A | Discharge status: Home / Self Care | Payer: Self-pay | Source: Home / Self Care | Attending: Internal Medicine

## 2019-03-15 ENCOUNTER — Ambulatory Visit: Payer: Medicare Other | Admitting: Registered Nurse

## 2019-03-15 ENCOUNTER — Encounter: Payer: Self-pay | Admitting: Anesthesiology

## 2019-03-15 ENCOUNTER — Ambulatory Visit
Admission: RE | Admit: 2019-03-15 | Discharge: 2019-03-15 | Disposition: A | Discharge status: Home / Self Care | Payer: Medicare Other | Attending: Internal Medicine | Admitting: Internal Medicine

## 2019-03-15 DIAGNOSIS — Z96651 Presence of right artificial knee joint: Secondary | ICD-10-CM | POA: Insufficient documentation

## 2019-03-15 DIAGNOSIS — Z8371 Family history of colonic polyps: Secondary | ICD-10-CM | POA: Diagnosis present

## 2019-03-15 DIAGNOSIS — Z79899 Other long term (current) drug therapy: Secondary | ICD-10-CM | POA: Diagnosis not present

## 2019-03-15 DIAGNOSIS — Z885 Allergy status to narcotic agent status: Secondary | ICD-10-CM | POA: Diagnosis not present

## 2019-03-15 DIAGNOSIS — K5730 Diverticulosis of large intestine without perforation or abscess without bleeding: Secondary | ICD-10-CM | POA: Diagnosis not present

## 2019-03-15 DIAGNOSIS — N400 Enlarged prostate without lower urinary tract symptoms: Secondary | ICD-10-CM | POA: Insufficient documentation

## 2019-03-15 DIAGNOSIS — Z886 Allergy status to analgesic agent status: Secondary | ICD-10-CM | POA: Diagnosis not present

## 2019-03-15 DIAGNOSIS — Z85828 Personal history of other malignant neoplasm of skin: Secondary | ICD-10-CM | POA: Diagnosis not present

## 2019-03-15 DIAGNOSIS — Z1211 Encounter for screening for malignant neoplasm of colon: Secondary | ICD-10-CM | POA: Insufficient documentation

## 2019-03-15 DIAGNOSIS — Z9842 Cataract extraction status, left eye: Secondary | ICD-10-CM | POA: Diagnosis not present

## 2019-03-15 DIAGNOSIS — Z888 Allergy status to other drugs, medicaments and biological substances status: Secondary | ICD-10-CM | POA: Diagnosis not present

## 2019-03-15 DIAGNOSIS — Z86010 Personal history of colonic polyps: Secondary | ICD-10-CM | POA: Diagnosis present

## 2019-03-15 DIAGNOSIS — K640 First degree hemorrhoids: Secondary | ICD-10-CM | POA: Insufficient documentation

## 2019-03-15 HISTORY — DX: Personal history of other diseases of the digestive system: Z87.19

## 2019-03-15 HISTORY — DX: Cortical age-related cataract, unspecified eye: H25.019

## 2019-03-15 HISTORY — DX: Diverticulitis of intestine, part unspecified, without perforation or abscess without bleeding: K57.92

## 2019-03-15 HISTORY — PX: COLONOSCOPY WITH PROPOFOL: SHX5780

## 2019-03-15 HISTORY — DX: Malignant (primary) neoplasm, unspecified: C80.1

## 2019-03-15 LAB — SARS CORONAVIRUS 2 BY RT PCR (HOSPITAL ORDER, PERFORMED IN ~~LOC~~ HOSPITAL LAB): SARS Coronavirus 2: NEGATIVE

## 2019-03-15 SURGERY — COLONOSCOPY WITH PROPOFOL
Anesthesia: General

## 2019-03-15 MED ORDER — GLYCOPYRROLATE 0.2 MG/ML IJ SOLN
INTRAMUSCULAR | Status: AC
  Filled 2019-03-15: qty 1

## 2019-03-15 MED ORDER — PROPOFOL 500 MG/50ML IV EMUL
INTRAVENOUS | Status: DC | PRN
  Administered 2019-03-15: 140 ug/kg/min via INTRAVENOUS

## 2019-03-15 MED ORDER — PROPOFOL 500 MG/50ML IV EMUL
INTRAVENOUS | Status: AC
  Filled 2019-03-15: qty 100

## 2019-03-15 MED ORDER — PROPOFOL 500 MG/50ML IV EMUL
INTRAVENOUS | Status: AC
  Filled 2019-03-15: qty 50

## 2019-03-15 MED ORDER — LIDOCAINE HCL (PF) 2 % IJ SOLN
INTRAMUSCULAR | Status: AC
  Filled 2019-03-15: qty 10

## 2019-03-15 MED ORDER — PHENYLEPHRINE HCL (PRESSORS) 10 MG/ML IV SOLN
INTRAVENOUS | Status: DC | PRN
  Administered 2019-03-15: 100 ug via INTRAVENOUS

## 2019-03-15 MED ORDER — PROPOFOL 10 MG/ML IV BOLUS
INTRAVENOUS | Status: DC | PRN
  Administered 2019-03-15 (×2): 30 mg via INTRAVENOUS
  Administered 2019-03-15: 70 mg via INTRAVENOUS

## 2019-03-15 MED ORDER — SODIUM CHLORIDE 0.9 % IV SOLN
INTRAVENOUS | Status: DC
  Administered 2019-03-15: 1000 mL via INTRAVENOUS

## 2019-03-15 NOTE — H&P (Signed)
Outpatient short stay form Pre-procedure 03/15/2019 8:53 AM Sandhya Denherder K. Alice Reichert, M.D.  Primary Physician: Juluis Pitch, M.D.  Reason for visit:  Personal hx of adenomatous colon polyps  History of present illness:                            Patient presents for colonoscopy for a personal hx of colon polyps. The patient denies abdominal pain, abnormal weight loss or rectal bleeding.    No current facility-administered medications for this encounter.   Medications Prior to Admission  Medication Sig Dispense Refill Last Dose  . fluticasone (FLONASE) 50 MCG/ACT nasal spray Place 1 spray into both nostrils daily.     Marland Kitchen omeprazole (PRILOSEC) 20 MG capsule Take 20 mg by mouth daily.     Marland Kitchen triamcinolone ointment (KENALOG) 0.1 % Apply 1 application topically 2 (two) times daily.     . Calcium Carbonate-Vitamin D (CALCIUM 500/D PO) Take 1 tablet daily by mouth.     . dutasteride (AVODART) 0.5 MG capsule Take 0.5 mg daily by mouth.      . psyllium (METAMUCIL) 58.6 % powder Take 1 tablespoon by mouth once daily     . TADALAFIL PO Take 7 mg daily as needed by mouth (bph).     . tamsulosin (FLOMAX) 0.4 MG CAPS capsule Take 0.4 mg daily by mouth.         Allergies  Allergen Reactions  . Aspirin Other (See Comments)    Abdominal pain  . Tylenol [Acetaminophen] Other (See Comments)    Abdominal pain  . Adhesive [Tape] Itching and Rash  . Codeine Itching and Rash  . Neosporin [Neomycin-Bacitracin Zn-Polymyx] Itching and Rash  . Silver Nitrate Itching and Rash     Past Medical History:  Diagnosis Date  . Arthritis    knees  . BPH (benign prostatic hypertrophy)   . Cancer ([place])    BASAL CELL CA-RIGHT UPPER ARM  . Cataract cortical, senile   . Diverticulitis   . H/O hernia repair   . History of colonic polyps 01/05/2019    Review of systems:  Otherwise negative.    Physical Exam  Gen: Alert, oriented. Appears stated age.  HEENT: [place]/AT. PERRLA. Lungs: CTA, no wheezes. CV: RR nl  S1, S2. Abd: soft, benign, no masses. BS+ Ext: No edema. Pulses 2+    Planned procedures: Proceed with colonoscopy. The patient understands the nature of the planned procedure, indications, risks, alternatives and potential complications including but not limited to bleeding, infection, perforation, damage to internal organs and possible oversedation/side effects from anesthesia. The patient agrees and gives consent to proceed.  Please refer to procedure notes for findings, recommendations and patient disposition/instructions.     Joeph Szatkowski K. Alice Reichert, M.D. Gastroenterology 03/15/2019  8:53 AM

## 2019-03-15 NOTE — Anesthesia Postprocedure Evaluation (Signed)
Anesthesia Post Note  Patient: Corey Edwards  Procedure(s) Performed: COLONOSCOPY WITH PROPOFOL (N/A )  Patient location during evaluation: Endoscopy Anesthesia Type: General Level of consciousness: awake and alert and oriented Pain management: pain level controlled Vital Signs Assessment: post-procedure vital signs reviewed and stable Respiratory status: spontaneous breathing Cardiovascular status: blood pressure returned to baseline Anesthetic complications: no     Last Vitals:  Vitals:   03/15/19 1039 03/15/19 1045  BP: (!) 161/82   Pulse: (!) 105   Resp: 15   Temp: 36.6 C   SpO2: 95% 95%    Last Pain:  Vitals:   03/15/19 1105  TempSrc:   PainSc: 0-No pain                 Aira Sallade

## 2019-03-15 NOTE — Anesthesia Post-op Follow-up Note (Signed)
Anesthesia QCDR form completed.        

## 2019-03-15 NOTE — Op Note (Signed)
[facility] Gastroenterology Patient Name: Corey Edwards Procedure Date: 03/15/2019 10:08 AM MRN: [id number] Account #: [id number] Date of Birth: August 18, 1945 Admit Type: Outpatient Age: 73 Room: [facility] ENDO ROOM 3 Gender: Male Note Status: Finalized Procedure:            Colonoscopy Indications:          Colon cancer screening in patient at increased risk:                        Family history of 1st-degree relative with colon polyps Providers:            Benay Pike. Marjie Chea MD, MD Medicines:            Propofol per Anesthesia Complications:        No immediate complications. Procedure:            Pre-Anesthesia Assessment:                       - The risks and benefits of the procedure and the                        sedation options and risks were discussed with the                        patient. All questions were answered and informed                        consent was obtained.                       - Patient identification and proposed procedure were                        verified prior to the procedure by the nurse. The                        procedure was verified in the procedure room.                       - ASA Grade Assessment: III - A patient with severe                        systemic disease.                       - After reviewing the risks and benefits, the patient                        was deemed in satisfactory condition to undergo the                        procedure.                       After obtaining informed consent, the colonoscope was                        passed under direct vision. Throughout the procedure,                        the patient's blood pressure, pulse, and oxygen  saturations were monitored continuously. The                        Colonoscope was introduced through the anus and                        advanced to the the cecum, identified by appendiceal                        orifice and ileocecal  valve. The colonoscopy was                        performed without difficulty. The patient tolerated the                        procedure well. The quality of the bowel preparation                        was good. The ileocecal valve, appendiceal orifice, and                        rectum were photographed. Findings:      The perianal and digital rectal examinations were normal. Pertinent       negatives include normal sphincter tone and no palpable rectal lesions.      Multiple small and large-mouthed diverticula were found in the sigmoid       colon.      Non-bleeding internal hemorrhoids were found during retroflexion. The       hemorrhoids were Grade I (internal hemorrhoids that do not prolapse).      The exam was otherwise without abnormality. Impression:           - Diverticulosis in the sigmoid colon.                       - Non-bleeding internal hemorrhoids.                       - The examination was otherwise normal.                       - No specimens collected. Recommendation:       - Patient has a contact number available for                        emergencies. The signs and symptoms of potential                        delayed complications were discussed with the patient.                        Return to normal activities tomorrow. Written discharge                        instructions were provided to the patient.                       - Resume previous diet.                       - Continue present medications.                       -  Repeat colonoscopy in 5 years for screening purposes.                       - The findings and recommendations were discussed with                        the patient.                       - Return to GI office PRN. Procedure Code(s):    --- Professional ---                       [id number], Colorectal cancer screening; colonoscopy on                        individual at high risk Diagnosis Code(s):    --- Professional ---                        K57.30, Diverticulosis of large intestine without                        perforation or abscess without bleeding                       K64.0, First degree hemorrhoids                       Z83.71, Family history of colonic polyps CPT copyright 2019 American Medical Association. All rights reserved. The codes documented in this report are preliminary and upon coder review may  be revised to meet current compliance requirements. Efrain Sella MD, MD 03/15/2019 10:35:20 AM This report has been signed electronically. Number of Addenda: 0 Note Initiated On: 03/15/2019 10:08 AM Scope Withdrawal Time: 0 hours 6 minutes 21 seconds  Total Procedure Duration: 0 hours 9 minutes 6 seconds  Estimated Blood Loss: Estimated blood loss: none.      [facility]

## 2019-03-15 NOTE — Anesthesia Preprocedure Evaluation (Signed)
Anesthesia Evaluation  Patient identified by MRN, date of birth, ID band Patient awake    Reviewed: Allergy & Precautions, H&P , NPO status , Patient's Chart, lab work & pertinent test results  History of Anesthesia Complications Negative for: history of anesthetic complications  Airway Mallampati: III  TM Distance: >3 FB Neck ROM: full    Dental  (+) Chipped, Caps   Pulmonary neg shortness of breath, former smoker,           Cardiovascular Exercise Tolerance: Good (-) angina(-) Past MI and (-) DOE      Neuro/Psych negative neurological ROS  negative psych ROS   GI/Hepatic Neg liver ROS, diverticulitis   Endo/Other  negative endocrine ROS  Renal/GU      Musculoskeletal  (+) Arthritis ,   Abdominal   Peds  Hematology negative hematology ROS (+)   Anesthesia Other Findings Past Medical History: No date: Arthritis     Comment:  knees No date: BPH (benign prostatic hypertrophy)  Past Surgical History: 07/24/14: CATARACT EXTRACTION W/ INTRAOCULAR LENS IMPLANT; Left 09/18/03, 06/12/10, 11/15/13: COLONOSCOPY No date: EYE SURGERY No date: HERNIA REPAIR No date: REPLACEMENT TOTAL KNEE; Right  BMI    Body Mass Index:  28.65 kg/m      Reproductive/Obstetrics negative OB ROS                             Anesthesia Physical  Anesthesia Plan  ASA: II  Anesthesia Plan: General   Post-op Pain Management:    Induction: Intravenous  PONV Risk Score and Plan: TIVA  Airway Management Planned: Nasal Cannula  Additional Equipment:   Intra-op Plan:   Post-operative Plan:   Informed Consent: I have reviewed the patients History and Physical, chart, labs and discussed the procedure including the risks, benefits and alternatives for the proposed anesthesia with the patient or authorized representative who has indicated his/her understanding and acceptance.     Dental Advisory  Given  Plan Discussed with: Anesthesiologist, CRNA and Surgeon  Anesthesia Plan Comments: (Patient consented for risks of anesthesia including but not limited to:  - adverse reactions to medications - damage to teeth, lips or other oral mucosa - sore throat or hoarseness - Damage to heart, brain, lungs or loss of life  Patient voiced understanding.)        Anesthesia Quick Evaluation

## 2019-03-15 NOTE — Transfer of Care (Signed)
Immediate Anesthesia Transfer of Care Note  Patient: Corey Edwards  Procedure(s) Performed: COLONOSCOPY WITH PROPOFOL (N/A )  Patient Location: PACU  Anesthesia Type:General  Level of Consciousness: awake, alert  and oriented  Airway & Oxygen Therapy: Patient Spontanous Breathing and Patient connected to nasal cannula oxygen  Post-op Assessment: Report given to RN and Post -op Vital signs reviewed and stable  Post vital signs: Reviewed and stable  Last Vitals:  Vitals Value Taken Time  BP 161/82 03/15/19 1039  Temp 36.6 C 03/15/19 1039  Pulse 100 03/15/19 1039  Resp 14 03/15/19 1039  SpO2 96 % 03/15/19 1039  Vitals shown include unvalidated device data.  Last Pain:  Vitals:   03/15/19 1035  TempSrc: Tympanic  PainSc: Asleep         Complications: No apparent anesthesia complications

## 2019-03-16 ENCOUNTER — Encounter: Payer: Self-pay | Admitting: Internal Medicine

## 2020-10-15 ENCOUNTER — Other Ambulatory Visit: Payer: Self-pay | Admitting: Gastroenterology

## 2020-10-15 ENCOUNTER — Other Ambulatory Visit (HOSPITAL_COMMUNITY): Payer: Self-pay | Admitting: Gastroenterology

## 2020-10-15 DIAGNOSIS — K5900 Constipation, unspecified: Secondary | ICD-10-CM

## 2020-10-15 DIAGNOSIS — R194 Change in bowel habit: Secondary | ICD-10-CM

## 2020-11-02 ENCOUNTER — Other Ambulatory Visit: Payer: Self-pay

## 2020-11-02 ENCOUNTER — Ambulatory Visit
Admission: RE | Admit: 2020-11-02 | Discharge: 2020-11-02 | Disposition: A | Discharge status: Home / Self Care | Payer: Medicare Other | Source: Ambulatory Visit | Attending: Gastroenterology | Admitting: Gastroenterology

## 2020-11-02 DIAGNOSIS — R194 Change in bowel habit: Secondary | ICD-10-CM

## 2020-11-02 DIAGNOSIS — K5900 Constipation, unspecified: Secondary | ICD-10-CM | POA: Diagnosis present

## 2020-11-02 LAB — POCT I-STAT CREATININE: Creatinine, Ser: 1 mg/dL (ref 0.61–1.24)

## 2020-11-02 MED ORDER — IOHEXOL 300 MG/ML  SOLN
100.0000 mL | Freq: Once | INTRAMUSCULAR | Status: AC | PRN
  Administered 2020-11-02: 100 mL via INTRAVENOUS

## 2021-04-21 IMAGING — NM NUCLEAR MEDICINE THREE PHASE BONE SCAN
5 series · 31 of 31 positions shown · non-contrast
Comparison: None.

MRI 04/23/2015

CLINICAL DATA: RIGHT knee swelling. Total RIGHT knee replacement 2
years prior.

EXAM:
NM BONE SCAN AND SPECT IMAGING with CT
TECHNIQUE: After intravenous injection of radiopharmaceutical, delayed planar
images were obtained in multiple projections. Additionally, delayed
triplanar SPECT images were obtained through the area of interest.
Radionuclide angiographic images, immediate static blood pool
images, and 3-hour delayed static images were obtained of the knee
after intravenous injection of radiopharmaceutical.
RADIOPHARMACEUTICALS:  23.9 mCi 4c-11m MDP IV

[Series 1000: bone (recon - ac ) · 4.8mm · 4.80mm/px · 6 of 78 frames shown]
[frame 7/78]
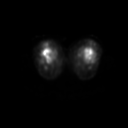
[frame 20/78]
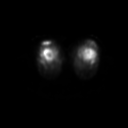
[frame 33/78]
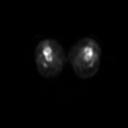
[frame 46/78]
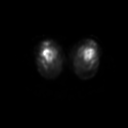
[frame 59/78]
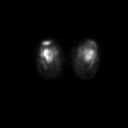
[frame 72/78]
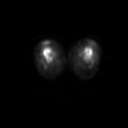

[Series 1000: bone statics · 4.80mm/px · 2 of 2 frames shown]
[frame 1/2]
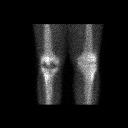
[frame 2/2]
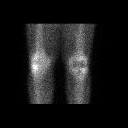

[Series 1001: fused axial · 19 of 68 slices shown]
[im 1/68]
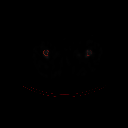
[im 4/68]
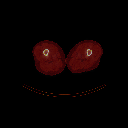
[im 8/68]
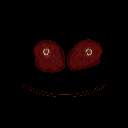
[im 12/68]
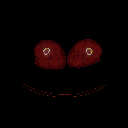
[im 15/68]
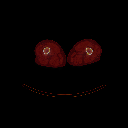
[im 19/68]
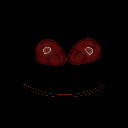
[im 23/68]
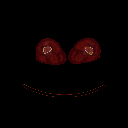
[im 27/68]
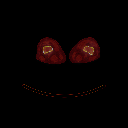
[im 30/68]
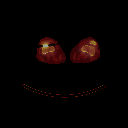
[im 34/68]
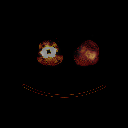
[im 38/68]
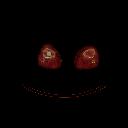
[im 41/68]
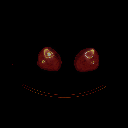
[im 45/68]
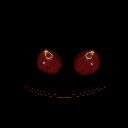
[im 49/68]
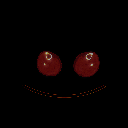
[im 53/68]
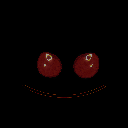
[im 56/68]
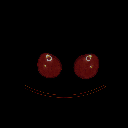
[im 60/68]
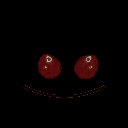
[im 64/68]
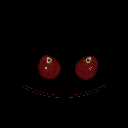
[im 68/68]
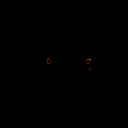

[Series 1002: fused coronal · 3 of 9 slices shown]
[im 1/9]
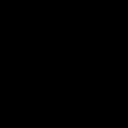
[im 5/9]
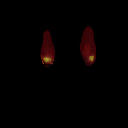
[im 9/9]
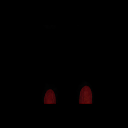

[Series 1003: fused sagittal · 1 of 5 slices shown]
[im 1/5]
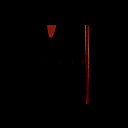

[31 of 31 positions shown; findings below may reference images not displayed]

FINDINGS: Vascular phase: No asymmetric or increased blood flow to the LEFT or
RIGHT knee.

Blood pool phase: No asymmetric or increased blood pool activity in
LEFT RIGHT knee. Photopenia in the RIGHT knee associated the
prosthetic.

Delayed phase: Minimal periprosthetic uptake in the RIGHT knee is
within normal limits.

Examination CT portion demonstrates no joint effusion
IMPRESSION: No evidence of loosening or infection of the RIGHT knee prosthetic.
No joint effusion.

## 2024-02-18 ENCOUNTER — Other Ambulatory Visit: Payer: Self-pay | Admitting: Nurse Practitioner

## 2024-02-18 ENCOUNTER — Ambulatory Visit
Admission: RE | Admit: 2024-02-18 | Discharge: 2024-02-18 | Disposition: A | Discharge status: Home / Self Care | Source: Ambulatory Visit | Attending: Nurse Practitioner | Admitting: Nurse Practitioner

## 2024-02-18 DIAGNOSIS — R1032 Left lower quadrant pain: Secondary | ICD-10-CM | POA: Diagnosis present

## 2024-04-29 ENCOUNTER — Emergency Department
Admission: EM | Admit: 2024-04-29 | Discharge: 2024-04-29 | Disposition: A | Discharge status: Home / Self Care | Attending: Emergency Medicine | Admitting: Emergency Medicine

## 2024-04-29 ENCOUNTER — Other Ambulatory Visit: Payer: Self-pay

## 2024-04-29 ENCOUNTER — Encounter: Payer: Self-pay | Admitting: Intensive Care

## 2024-04-29 DIAGNOSIS — R1032 Left lower quadrant pain: Secondary | ICD-10-CM | POA: Diagnosis present

## 2024-04-29 LAB — CBC
HCT: 40.7 % (ref 39.0–52.0)
Hemoglobin: 13.3 g/dL (ref 13.0–17.0)
MCH: 31.2 pg (ref 26.0–34.0)
MCHC: 32.7 g/dL (ref 30.0–36.0)
MCV: 95.5 fL (ref 80.0–100.0)
Platelets: 161 K/uL (ref 150–400)
RBC: 4.26 MIL/uL (ref 4.22–5.81)
RDW: 12.7 % (ref 11.5–15.5)
WBC: 5.5 K/uL (ref 4.0–10.5)
nRBC: 0 % (ref 0.0–0.2)

## 2024-04-29 LAB — COMPREHENSIVE METABOLIC PANEL WITH GFR
ALT: 18 U/L (ref 0–44)
AST: 24 U/L (ref 15–41)
Albumin: 4.1 g/dL (ref 3.5–5.0)
Alkaline Phosphatase: 52 U/L (ref 38–126)
Anion gap: 10 (ref 5–15)
BUN: 20 mg/dL (ref 8–23)
CO2: 25 mmol/L (ref 22–32)
Calcium: 9.4 mg/dL (ref 8.9–10.3)
Chloride: 105 mmol/L (ref 98–111)
Creatinine, Ser: 1.07 mg/dL (ref 0.61–1.24)
GFR, Estimated: >60 mL/min (ref >60)
Glucose, Bld: 105 mg/dL — ABNORMAL HIGH (ref 70–99)
Potassium: 4.3 mmol/L (ref 3.5–5.1)
Sodium: 140 mmol/L (ref 135–145)
Total Bilirubin: 1.1 mg/dL (ref 0.0–1.2)
Total Protein: 7.2 g/dL (ref 6.5–8.1)

## 2024-04-29 LAB — URINALYSIS, ROUTINE W REFLEX MICROSCOPIC
Bilirubin Urine: NEGATIVE
Glucose, UA: NEGATIVE mg/dL
Hgb urine dipstick: NEGATIVE
Ketones, ur: NEGATIVE mg/dL
Leukocytes,Ua: NEGATIVE
Nitrite: NEGATIVE
Protein, ur: NEGATIVE mg/dL
Specific Gravity, Urine: 1.024 (ref 1.005–1.030)
pH: 5 (ref 5.0–8.0)

## 2024-04-29 LAB — LIPASE, BLOOD: Lipase: 36 U/L (ref 11–51)

## 2024-04-29 NOTE — ED Triage Notes (Signed)
 Patient c/o left, lower abdominal pain X2 days.  Patient reports some intermittent pain that becomes unbearable.   Last normal BM today  Denies N/V/D  History enlarged prostate. Denies trouble urinating

## 2024-04-29 NOTE — ED Provider Notes (Signed)
 [facility] Provider Note    Event Date/Time   First MD Initiated Contact with Patient 04/29/24 2159     (approximate)   History   Chief Complaint Abdominal Pain   HPI  Corey Edwards is a 78 y.o. male with past medical history of diverticulitis and BPH who presents to the ED complaining of abdominal pain.  Patient reports that he had acute onset of severe pain in the left lower quadrant of his abdomen a couple of hours prior to arrival after working on his car.  He states that the pain was present for about an hour and a half, before it resolved on its own.  He denies any associated nausea, vomiting, diarrhea, dysuria, or flank pain.  He denies any history of similar episodes, does report history of left inguinal hernia but did not notice any bulging during the episode.  He has not noticed any pain or swelling in his groin.     Physical Exam   Triage Vital Signs: ED Triage Vitals  Encounter Vitals Group     BP 04/29/24 1816 (!) 146/99     Girls Systolic BP Percentile --      Girls Diastolic BP Percentile --      Boys Systolic BP Percentile --      Boys Diastolic BP Percentile --      Pulse Rate 04/29/24 1816 78     Resp 04/29/24 1816 16     Temp 04/29/24 1816 97.8 F (36.6 C)     Temp Source 04/29/24 1816 Oral     SpO2 04/29/24 1816 97 %     Weight 04/29/24 1817 199 lb (90.3 kg)     Height 04/29/24 1817 5' 6.5 (1.689 m)     Head Circumference --      Peak Flow --      Pain Score 04/29/24 1817 4     Pain Loc --      Pain Education --      Exclude from Growth Chart --     Most recent vital signs: Vitals:   04/29/24 1816 04/29/24 2207  BP: (!) 146/99 (!) 154/90  Pulse: 78 67  Resp: 16 18  Temp: 97.8 F (36.6 C) 97.9 F (36.6 C)  SpO2: 97% 99%    Constitutional: Alert and oriented. Eyes: Conjunctivae are normal. Head: Atraumatic. Nose: No congestion/rhinnorhea. Mouth/Throat: Mucous membranes are moist.  Cardiovascular: Normal rate,  regular rhythm. Grossly normal heart sounds.  2+ radial pulses bilaterally. Respiratory: Normal respiratory effort.  No retractions. Lungs CTAB. Gastrointestinal: Soft and nontender. No distention.  Small umbilical hernia noted, no inguinal hernia noted. Musculoskeletal: No lower extremity tenderness nor edema.  Neurologic:  Normal speech and language. No gross focal neurologic deficits are appreciated.    ED Results / Procedures / Treatments   Labs (all labs ordered are listed, but only abnormal results are displayed) Labs Reviewed  COMPREHENSIVE METABOLIC PANEL WITH GFR - Abnormal; Notable for the following components:      Result Value   Glucose, Bld 105 (*)    All other components within normal limits  URINALYSIS, ROUTINE W REFLEX MICROSCOPIC - Abnormal; Notable for the following components:   Color, Urine YELLOW (*)    APPearance CLEAR (*)    All other components within normal limits  LIPASE, BLOOD  CBC    PROCEDURES:  Critical Care performed: No  Procedures   MEDICATIONS ORDERED IN ED: Medications - No data to display   IMPRESSION /  MDM / ASSESSMENT AND PLAN / ED COURSE  I reviewed the triage vital signs and the nursing notes.                              78 y.o. male with past medical history of diverticulitis and BPH who presents to the ED complaining of acute onset severe pain in the left lower quadrant of his abdomen that has since resolved.  Patient's presentation is most consistent with acute presentation with potential threat to life or bodily function.  Differential diagnosis includes, but is not limited to, diverticulitis, kidney stone, inguinal hernia, muscle spasm.  Patient well-appearing and in no acute distress, vital signs are unremarkable.  His abdomen is soft without any tenderness whatsoever, he does have a small umbilical hernia but this does not seem to be the source of his pain and I do not appreciate an inguinal hernia.  Labs without  significant anemia, leukocytosis, electrolyte abnormality, or AKI.  LFTs and lipase are unremarkable, urinalysis with no signs of infection.  With reassuring workup and benign exam with resolution of his pain, patient appropriate for discharge home with outpatient follow-up.  He was counseled to return to the ED for new or worsening symptoms, patient agrees with plan.      FINAL CLINICAL IMPRESSION(S) / ED DIAGNOSES   Final diagnoses:  Left lower quadrant abdominal pain     Rx / DC Orders   ED Discharge Orders     None        Note:  This document was prepared using Dragon voice recognition software and may include unintentional dictation errors.   Willo Dunnings, MD 04/29/24 2240
